# Patient Record
Sex: Female | Born: 1970 | Race: Black or African American | Hispanic: No | Marital: Single | Smoking: Never smoker
Health system: Southern US, Community
[De-identification: ages and names within clinical notes are randomized; demographics above are authoritative.]

---

## 1998-06-30 ENCOUNTER — Ambulatory Visit (HOSPITAL_COMMUNITY): Admission: RE | Admit: 1998-06-30 | Discharge: 1998-06-30 | Payer: Self-pay | Admitting: General Surgery

## 1999-03-18 ENCOUNTER — Other Ambulatory Visit: Admission: RE | Admit: 1999-03-18 | Discharge: 1999-03-18 | Payer: Self-pay | Admitting: Obstetrics and Gynecology

## 1999-12-01 ENCOUNTER — Ambulatory Visit (HOSPITAL_BASED_OUTPATIENT_CLINIC_OR_DEPARTMENT_OTHER): Admission: RE | Admit: 1999-12-01 | Discharge: 1999-12-01 | Payer: Self-pay | Admitting: Orthopaedic Surgery

## 2000-07-07 ENCOUNTER — Other Ambulatory Visit: Admission: RE | Admit: 2000-07-07 | Discharge: 2000-07-07 | Payer: Self-pay | Admitting: Obstetrics and Gynecology

## 2001-07-31 ENCOUNTER — Other Ambulatory Visit: Admission: RE | Admit: 2001-07-31 | Discharge: 2001-07-31 | Payer: Self-pay | Admitting: Obstetrics and Gynecology

## 2002-08-03 ENCOUNTER — Other Ambulatory Visit: Admission: RE | Admit: 2002-08-03 | Discharge: 2002-08-03 | Payer: Self-pay | Admitting: Obstetrics and Gynecology

## 2003-08-09 ENCOUNTER — Other Ambulatory Visit: Admission: RE | Admit: 2003-08-09 | Discharge: 2003-08-09 | Payer: Self-pay | Admitting: Obstetrics and Gynecology

## 2004-08-28 ENCOUNTER — Other Ambulatory Visit: Admission: RE | Admit: 2004-08-28 | Discharge: 2004-08-28 | Payer: Self-pay | Admitting: Obstetrics and Gynecology

## 2005-09-01 ENCOUNTER — Other Ambulatory Visit: Admission: RE | Admit: 2005-09-01 | Discharge: 2005-09-01 | Payer: Self-pay | Admitting: Obstetrics and Gynecology

## 2005-11-02 ENCOUNTER — Encounter: Admission: RE | Admit: 2005-11-02 | Discharge: 2005-11-02 | Payer: Self-pay | Admitting: Obstetrics and Gynecology

## 2010-08-23 ENCOUNTER — Encounter: Payer: Self-pay | Admitting: Obstetrics and Gynecology

## 2011-03-29 ENCOUNTER — Other Ambulatory Visit: Payer: Self-pay | Admitting: Internal Medicine

## 2011-03-29 DIAGNOSIS — R51 Headache: Secondary | ICD-10-CM

## 2011-03-31 ENCOUNTER — Ambulatory Visit
Admission: RE | Admit: 2011-03-31 | Discharge: 2011-03-31 | Disposition: A | Discharge status: Home / Self Care | Payer: BC Managed Care – PPO | Source: Ambulatory Visit | Attending: Internal Medicine | Admitting: Internal Medicine

## 2011-03-31 DIAGNOSIS — R51 Headache: Secondary | ICD-10-CM

## 2011-04-08 ENCOUNTER — Encounter (INDEPENDENT_AMBULATORY_CARE_PROVIDER_SITE_OTHER): Payer: BC Managed Care – PPO | Admitting: Ophthalmology

## 2011-04-08 DIAGNOSIS — H40019 Open angle with borderline findings, low risk, unspecified eye: Secondary | ICD-10-CM

## 2011-04-08 DIAGNOSIS — H5210 Myopia, unspecified eye: Secondary | ICD-10-CM

## 2011-04-08 DIAGNOSIS — H43819 Vitreous degeneration, unspecified eye: Secondary | ICD-10-CM

## 2016-03-25 ENCOUNTER — Other Ambulatory Visit: Payer: Self-pay | Admitting: Internal Medicine

## 2016-03-25 ENCOUNTER — Ambulatory Visit
Admission: RE | Admit: 2016-03-25 | Discharge: 2016-03-25 | Disposition: A | Discharge status: Home / Self Care | Payer: BC Managed Care – PPO | Source: Ambulatory Visit | Attending: Internal Medicine | Admitting: Internal Medicine

## 2016-03-25 DIAGNOSIS — M79645 Pain in left finger(s): Secondary | ICD-10-CM

## 2017-09-06 ENCOUNTER — Other Ambulatory Visit: Payer: Self-pay | Admitting: Internal Medicine

## 2017-09-06 DIAGNOSIS — R1012 Left upper quadrant pain: Secondary | ICD-10-CM

## 2017-09-09 ENCOUNTER — Ambulatory Visit
Admission: RE | Admit: 2017-09-09 | Discharge: 2017-09-09 | Disposition: A | Discharge status: Home / Self Care | Payer: BC Managed Care – PPO | Source: Ambulatory Visit | Attending: Internal Medicine | Admitting: Internal Medicine

## 2017-09-09 DIAGNOSIS — R1012 Left upper quadrant pain: Secondary | ICD-10-CM

## 2019-01-14 ENCOUNTER — Emergency Department (HOSPITAL_COMMUNITY)
Admission: EM | Admit: 2019-01-14 | Discharge: 2019-01-14 | Disposition: A | Discharge status: Home / Self Care | Payer: BC Managed Care – PPO | Attending: Emergency Medicine | Admitting: Emergency Medicine

## 2019-01-14 ENCOUNTER — Encounter (HOSPITAL_COMMUNITY): Payer: Self-pay | Admitting: *Deleted

## 2019-01-14 ENCOUNTER — Other Ambulatory Visit: Payer: Self-pay

## 2019-01-14 DIAGNOSIS — S30861A Insect bite (nonvenomous) of abdominal wall, initial encounter: Secondary | ICD-10-CM | POA: Insufficient documentation

## 2019-01-14 DIAGNOSIS — Y929 Unspecified place or not applicable: Secondary | ICD-10-CM | POA: Insufficient documentation

## 2019-01-14 DIAGNOSIS — W57XXXA Bitten or stung by nonvenomous insect and other nonvenomous arthropods, initial encounter: Secondary | ICD-10-CM | POA: Insufficient documentation

## 2019-01-14 DIAGNOSIS — Y998 Other external cause status: Secondary | ICD-10-CM | POA: Diagnosis not present

## 2019-01-14 DIAGNOSIS — Y9389 Activity, other specified: Secondary | ICD-10-CM | POA: Insufficient documentation

## 2019-01-14 MED ORDER — DOXYCYCLINE HYCLATE 100 MG PO TABS
200.0000 mg | ORAL_TABLET | Freq: Once | ORAL | Status: AC
  Administered 2019-01-14: 200 mg via ORAL
  Filled 2019-01-14: qty 2

## 2019-01-14 MED ORDER — DOXYCYCLINE HYCLATE 100 MG PO CAPS: 200.0000 mg | ORAL_CAPSULE | Freq: Two times a day (BID) | ORAL | 0 refills | Status: AC

## 2019-01-14 NOTE — ED Provider Notes (Signed)
[facility] Provider Note   CSN: [id number] Arrival date & time: 01/14/19  1800    History   Chief Complaint Chief Complaint  Patient presents with  . Insect Bite    HPI Angela Obrien is a 48 y.o. female who presents with possible tick bite to umbilicus that she noticed 3 days ago. Pt is unsure if it is a tick or if it is a mole but wanted to come and get evaluated; she thinks she may have noticed some legs coming out as well. Pt works inside; she states she has not been outside anywhere in the woods; she takes out her trash and thinks that's the only time she may have come into contact with a tick. No other areas on her body. Denies headache, vision changes, dizziness, lightheadedness, myalgias, or any other symptoms.       History reviewed. No pertinent past medical history.  There are no active problems to display for this patient.   History reviewed. No pertinent surgical history.   OB History   No obstetric history on file.      Home Medications    Prior to Admission medications   Not on File    Family History History reviewed. No pertinent family history.  Social History Social History   Tobacco Use  . Smoking status: Never Smoker  Substance Use Topics  . Alcohol use: Not on file  . Drug use: Not on file     Allergies   Patient has no known allergies.   Review of Systems Review of Systems  Constitutional: Negative for chills and fever.  Eyes: Negative for visual disturbance.  Musculoskeletal: Negative for arthralgias and myalgias.  Skin: Positive for rash.  Neurological: Negative for dizziness, light-headedness and headaches.     Physical Exam Updated Vital Signs BP (!) 157/93 (BP Location: Right Arm)   Pulse (!) 125   Temp 98.4 F (36.9 C) (Oral)   Resp 18   SpO2 96%   Physical Exam Vitals signs and nursing note reviewed.  Constitutional:      Appearance: She is not ill-appearing.  HENT:      Head: Normocephalic and atraumatic.  Eyes:     Conjunctiva/sclera: Conjunctivae normal.  Cardiovascular:     Rate and Rhythm: Normal rate and regular rhythm.  Pulmonary:     Effort: Pulmonary effort is normal.     Breath sounds: Normal breath sounds.  Musculoskeletal:     Comments: Engorged tick to umbilicus at 3 o'clock position; no erythema or edema; no EM rash  Skin:    General: Skin is warm and dry.     Coloration: Skin is not jaundiced.  Neurological:     Mental Status: She is alert.      ED Treatments / Results  Labs (all labs ordered are listed, but only abnormal results are displayed) Labs Reviewed - No data to display  EKG None  Radiology No results found.  Procedures .Foreign Body Removal  Date/Time: 01/14/2019 7:32 PM Performed by: Tanda RockersVenter, Julionna Marczak, PA-C Authorized by: Tanda RockersVenter, Quinntin Malter, PA-C  Consent: Verbal consent obtained. Consent given by: patient Patient identity confirmed: verbally with patient Body area: skin General location: trunk Location details: abdomen Complexity: simple 1 objects recovered. Objects recovered: Tick Post-procedure assessment: foreign body removed   (including critical care time)  Medications Ordered in ED Medications  doxycycline (VIBRA-TABS) tablet 200 mg (200 mg Oral Given 01/14/19 1903)     Initial Impression / Assessment and Plan /  ED Course  I have reviewed the triage vital signs and the nursing notes.  Pertinent labs & imaging results that were available during my care of the patient were reviewed by me and considered in my medical decision making (see chart for details).     Pt is a 48 year old female for evaluated of "spot" that she noticed to her umbilicus a couple of days ago; unsure if it is a tick or a mole. No known exposure to ticks/area where ticks are present. Pt stays indoors mostly. No other symptoms at this time including dizziness, lightheadedness, headache, vision changes, myalgias.   Tick  present on exam; removed without difficulty and cleaned with alcohol. Prophylactic doxycycline given in the ED to cover for RMSF and Lyme disease. Advised to follow up with PCP. Initially tachycardic to 125; improved to 100 after removal of tick. Pt believes she may have just been worked up when she got in. Pt stable for discharge home at this time. Strict return precautions discussed.   Received call from patient after she was discharged; she reports she went home and had 1 episode of vomiting. She believes she may have vomited her antibiotics; she reports this happens to her when she takes medications on an empty stomach. Will prescribe prophylactic dose of doxycycline for patient to take in the AM to ensure she is covered. Pt is in agreement with plan.        Final Clinical Impressions(s) / ED Diagnoses   Final diagnoses:  Tick bite, initial encounter    ED Discharge Orders    None       Eustaquio Maize, Hershal Coria 01/14/19 2258    Jola Schmidt, MD 01/15/19 1404

## 2019-01-14 NOTE — ED Notes (Signed)
Pt verbalized written and verbal discharge instructions. Left ambulatory with steady gait to POV.

## 2019-01-14 NOTE — Discharge Instructions (Addendum)
Please see resource guide attached for symptoms to look out for; if you experience any of the symptoms please return to the ED immediately.

## 2019-01-14 NOTE — ED Triage Notes (Signed)
Pt reports having possible tick or possible mole near umbilicus. Has mild pain to area but no redness.

## 2019-01-15 ENCOUNTER — Telehealth: Payer: Self-pay | Admitting: *Deleted

## 2019-01-15 NOTE — Telephone Encounter (Signed)
Pharmacy called related to Rx: doxycycline dosage too high .Marland KitchenMarland KitchenEDCM clarified with EDP (Petrucelli) to change Rx to: doxycycline 200 mg, take one tablet and Zofran 4mg  tablet, take 1 tablet Q4 PRN N, V #5  .

## 2020-08-12 ENCOUNTER — Encounter (INDEPENDENT_AMBULATORY_CARE_PROVIDER_SITE_OTHER): Payer: Self-pay | Admitting: Otolaryngology

## 2020-08-12 ENCOUNTER — Other Ambulatory Visit: Payer: Self-pay

## 2020-08-12 ENCOUNTER — Ambulatory Visit (INDEPENDENT_AMBULATORY_CARE_PROVIDER_SITE_OTHER): Payer: BC Managed Care – PPO | Admitting: Otolaryngology

## 2020-08-12 VITALS — Temp 97.0°F

## 2020-08-12 DIAGNOSIS — H9201 Otalgia, right ear: Secondary | ICD-10-CM

## 2020-08-12 NOTE — Progress Notes (Signed)
HPI: Angela Obrien is a 50 y.o. female who returns today for evaluation of ears.  She has not had her ears cleaned in a while and wanted them checked.  More recently has had some intermittent right ear pain that comes and goes and may last 5 to .  She has not noted any hearing problems.  No drainage from the ears..  No past medical history on file. No past surgical history on file. Social History   Socioeconomic History  . Marital status: Single    Spouse name: Not on file  . Number of children: Not on file  . Years of education: Not on file  . Highest education level: Not on file  Occupational History  . Not on file  Tobacco Use  . Smoking status: Never Smoker  . Smokeless tobacco: Never Used  Substance and Sexual Activity  . Alcohol use: Not on file  . Drug use: Not on file  . Sexual activity: Not on file  Other Topics Concern  . Not on file  Social History Narrative  . Not on file   Social Determinants of Health   Financial Resource Strain: Not on file  Food Insecurity: Not on file  Transportation Needs: Not on file  Physical Activity: Not on file  Stress: Not on file  Social Connections: Not on file   No family history on file. No Known Allergies Prior to Admission medications   Medication Sig Start Date End Date Taking? Authorizing Provider  doxycycline (VIBRAMYCIN) 100 MG capsule Take 2 capsules (200 mg total) by mouth 2 (two) times daily. Patient not taking: Reported on 08/12/2020 01/14/19   Tanda Rockers, PA-C     Positive ROS: Otherwise negative  All other systems have been reviewed and were otherwise negative with the exception of those mentioned in the HPI and as above.  Physical Exam: Constitutional: Alert, well-appearing, no acute distress Ears: External ears without lesions or tenderness. Ear canals are clear bilaterally with no significant wax buildup.  TMs were clear bilaterally on microscopic exam with no middle ear abnormality noted.   Hearing screening with a tuning forks revealed good hearing in both ears. Nasal: External nose without lesions. Septum midline with mild rhinitis. Clear nasal passages Oral: Lips and gums without lesions. Tongue and palate mucosa without lesions. Posterior oropharynx clear.  Tonsil regions appear clear bilaterally. Neck: No palpable adenopathy or masses.  No palpable adenopathy.  No TMJ discomfort noted. Respiratory: Breathing comfortably  Skin: No facial/neck lesions or rash noted.  Procedures  Assessment: Intermittent right ear pain questionable etiology with normal examination otherwise.  Plan: Did not recommend any specific therapy at this point outside of trying to determine what may be causing the ear discomfort.  She will follow-up as needed.   Narda Bonds, MD

## 2021-02-04 ENCOUNTER — Other Ambulatory Visit: Payer: Self-pay | Admitting: Obstetrics & Gynecology

## 2021-02-04 DIAGNOSIS — R928 Other abnormal and inconclusive findings on diagnostic imaging of breast: Secondary | ICD-10-CM

## 2021-02-23 ENCOUNTER — Other Ambulatory Visit: Payer: Self-pay | Admitting: Obstetrics & Gynecology

## 2021-02-23 ENCOUNTER — Other Ambulatory Visit: Payer: Self-pay

## 2021-02-23 ENCOUNTER — Ambulatory Visit
Admission: RE | Admit: 2021-02-23 | Discharge: 2021-02-23 | Disposition: A | Discharge status: Home / Self Care | Payer: BC Managed Care – PPO | Source: Ambulatory Visit | Attending: Obstetrics & Gynecology | Admitting: Obstetrics & Gynecology

## 2021-02-23 DIAGNOSIS — R928 Other abnormal and inconclusive findings on diagnostic imaging of breast: Secondary | ICD-10-CM

## 2021-02-23 DIAGNOSIS — N63 Unspecified lump in unspecified breast: Secondary | ICD-10-CM

## 2021-03-04 ENCOUNTER — Other Ambulatory Visit: Payer: BC Managed Care – PPO

## 2021-03-04 ENCOUNTER — Other Ambulatory Visit: Payer: Self-pay | Admitting: Obstetrics & Gynecology

## 2021-03-04 ENCOUNTER — Other Ambulatory Visit: Payer: Self-pay

## 2021-03-04 ENCOUNTER — Ambulatory Visit
Admission: RE | Admit: 2021-03-04 | Discharge: 2021-03-04 | Disposition: A | Discharge status: Home / Self Care | Payer: BC Managed Care – PPO | Source: Ambulatory Visit | Attending: Obstetrics & Gynecology | Admitting: Obstetrics & Gynecology

## 2021-03-04 DIAGNOSIS — N63 Unspecified lump in unspecified breast: Secondary | ICD-10-CM

## 2021-03-19 ENCOUNTER — Ambulatory Visit
Admission: RE | Admit: 2021-03-19 | Discharge: 2021-03-19 | Disposition: A | Discharge status: Home / Self Care | Payer: BC Managed Care – PPO | Source: Ambulatory Visit | Attending: Obstetrics & Gynecology | Admitting: Obstetrics & Gynecology

## 2021-03-19 ENCOUNTER — Other Ambulatory Visit: Payer: Self-pay

## 2021-03-19 DIAGNOSIS — N63 Unspecified lump in unspecified breast: Secondary | ICD-10-CM

## 2021-03-19 HISTORY — PX: BREAST BIOPSY: SHX20

## 2022-07-07 ENCOUNTER — Other Ambulatory Visit: Payer: Self-pay | Admitting: Obstetrics & Gynecology

## 2022-07-07 DIAGNOSIS — N644 Mastodynia: Secondary | ICD-10-CM

## 2022-07-20 ENCOUNTER — Ambulatory Visit
Admission: RE | Admit: 2022-07-20 | Discharge: 2022-07-20 | Disposition: A | Discharge status: Home / Self Care | Payer: BC Managed Care – PPO | Source: Ambulatory Visit | Attending: Obstetrics & Gynecology | Admitting: Obstetrics & Gynecology

## 2022-07-20 DIAGNOSIS — N644 Mastodynia: Secondary | ICD-10-CM

## 2022-12-19 IMAGING — MG MM BREAST LOCALIZATION CLIP
4 series · 4 of 12 positions shown · non-contrast
Comparison: Previous exam(s).

CLINICAL DATA: Post biopsy mammogram of the left breast for clip
placement.

EXAM:
3D DIAGNOSTIC LEFT MAMMOGRAM POST STEREOTACTIC BIOPSY

[L LM synth-2D]
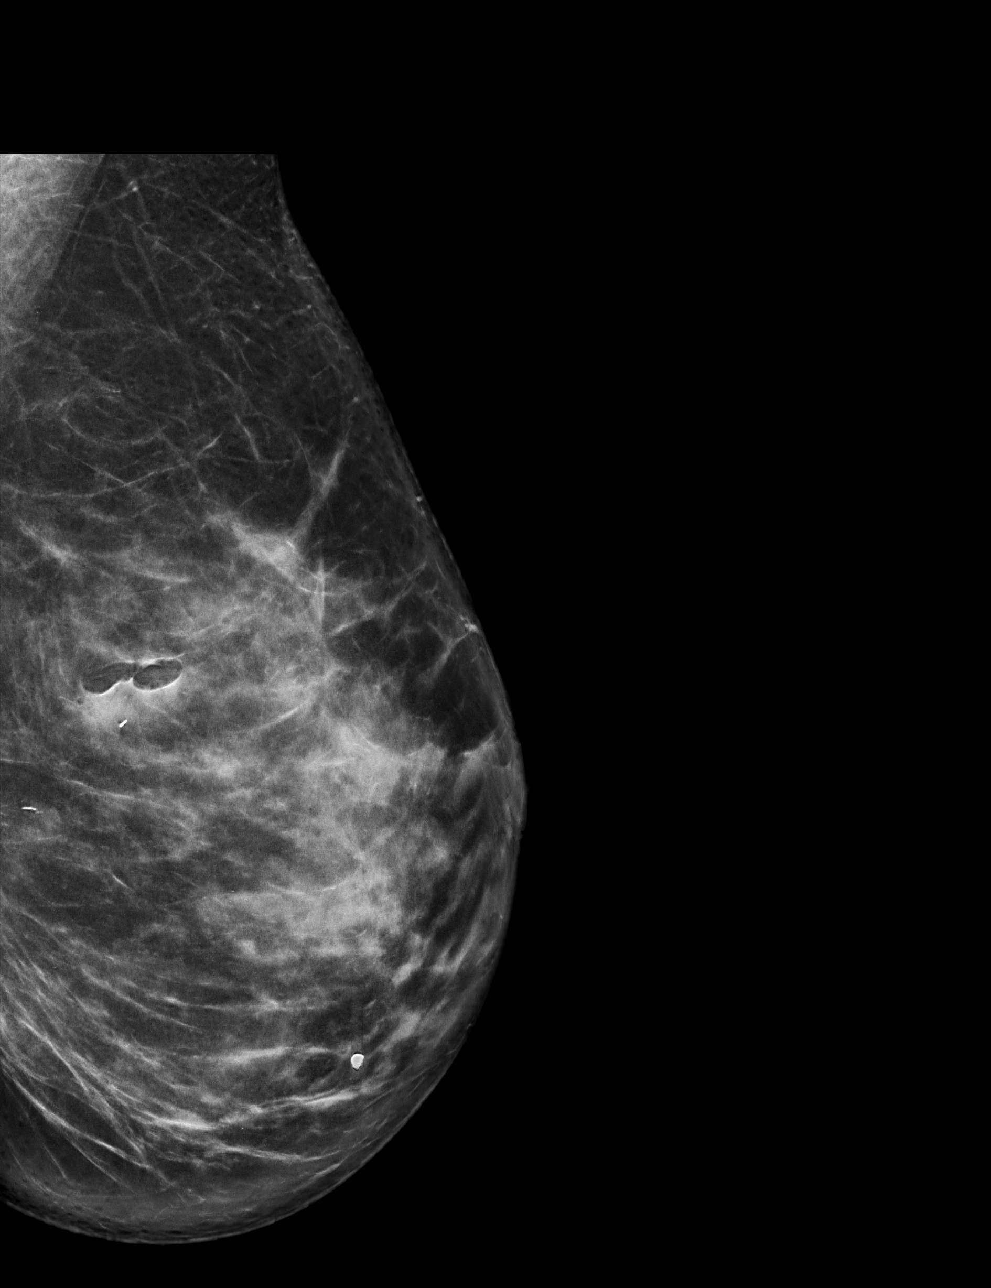

[L CC synth-2D]
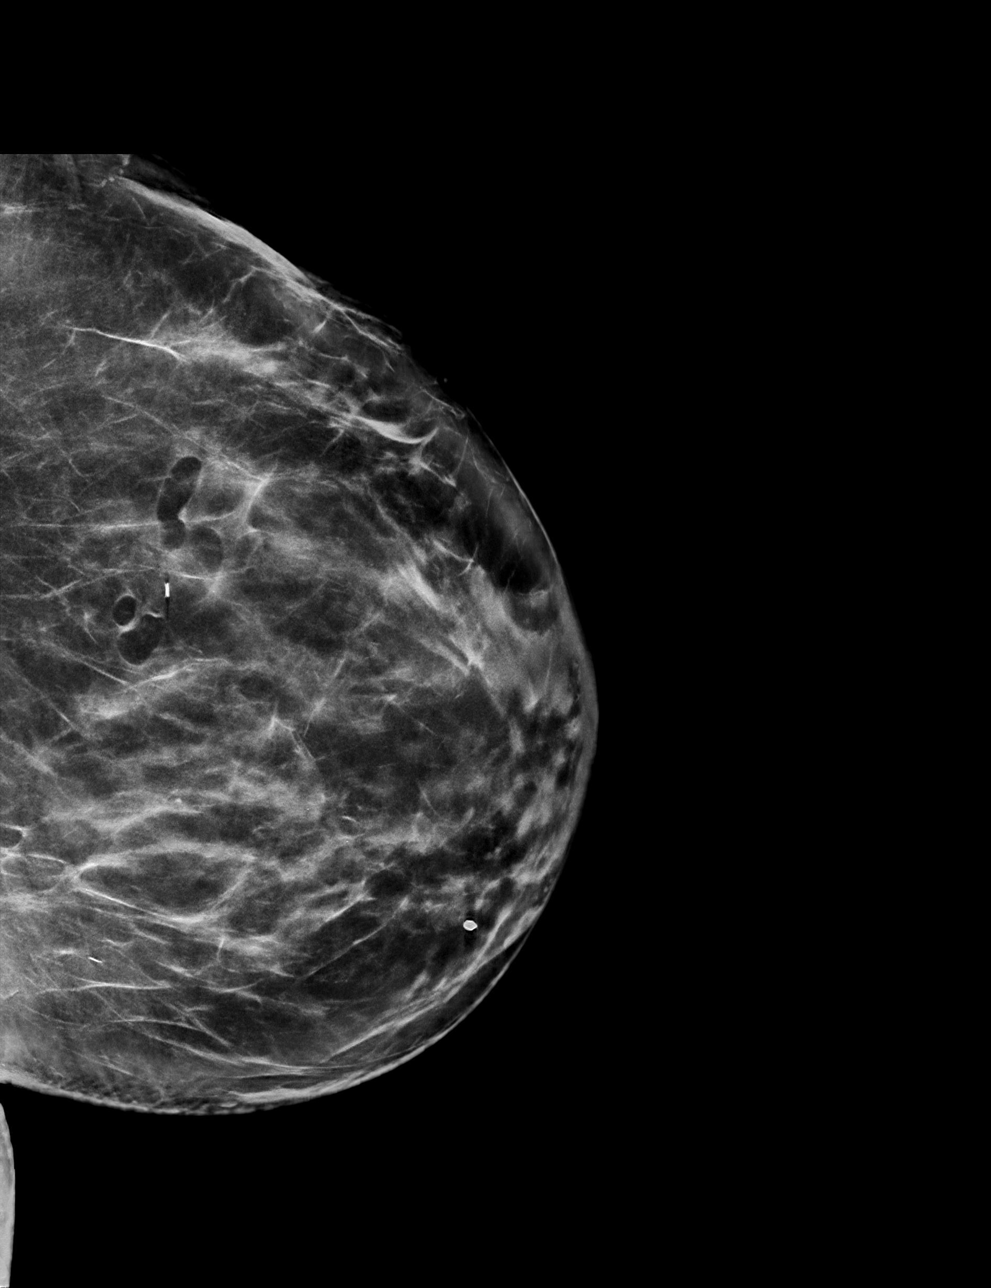

[L CC tomo · tomo slice 45/88.0]
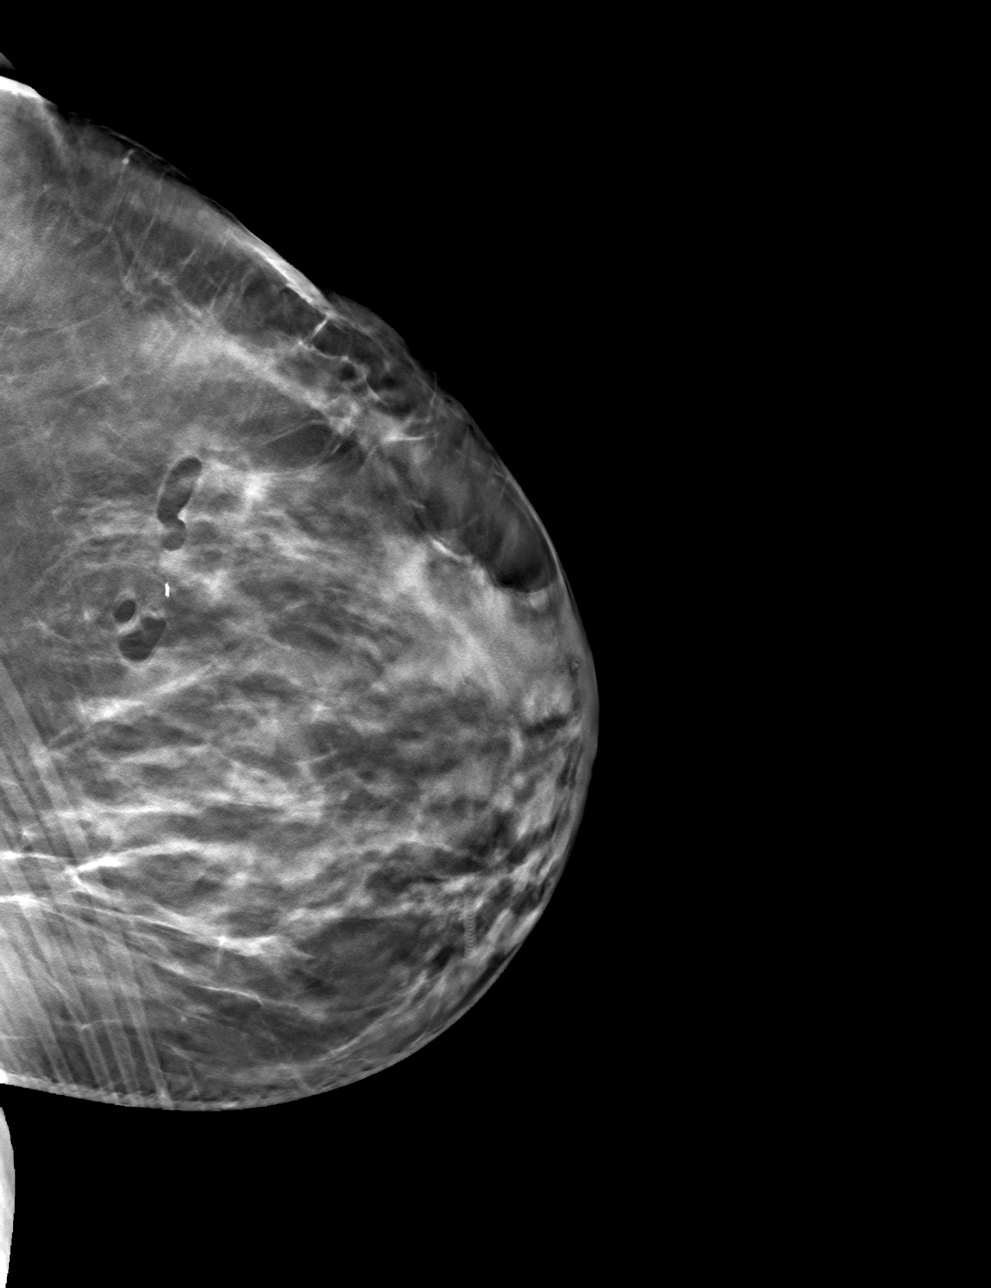

[L LM tomo · tomo slice 45/88.0]
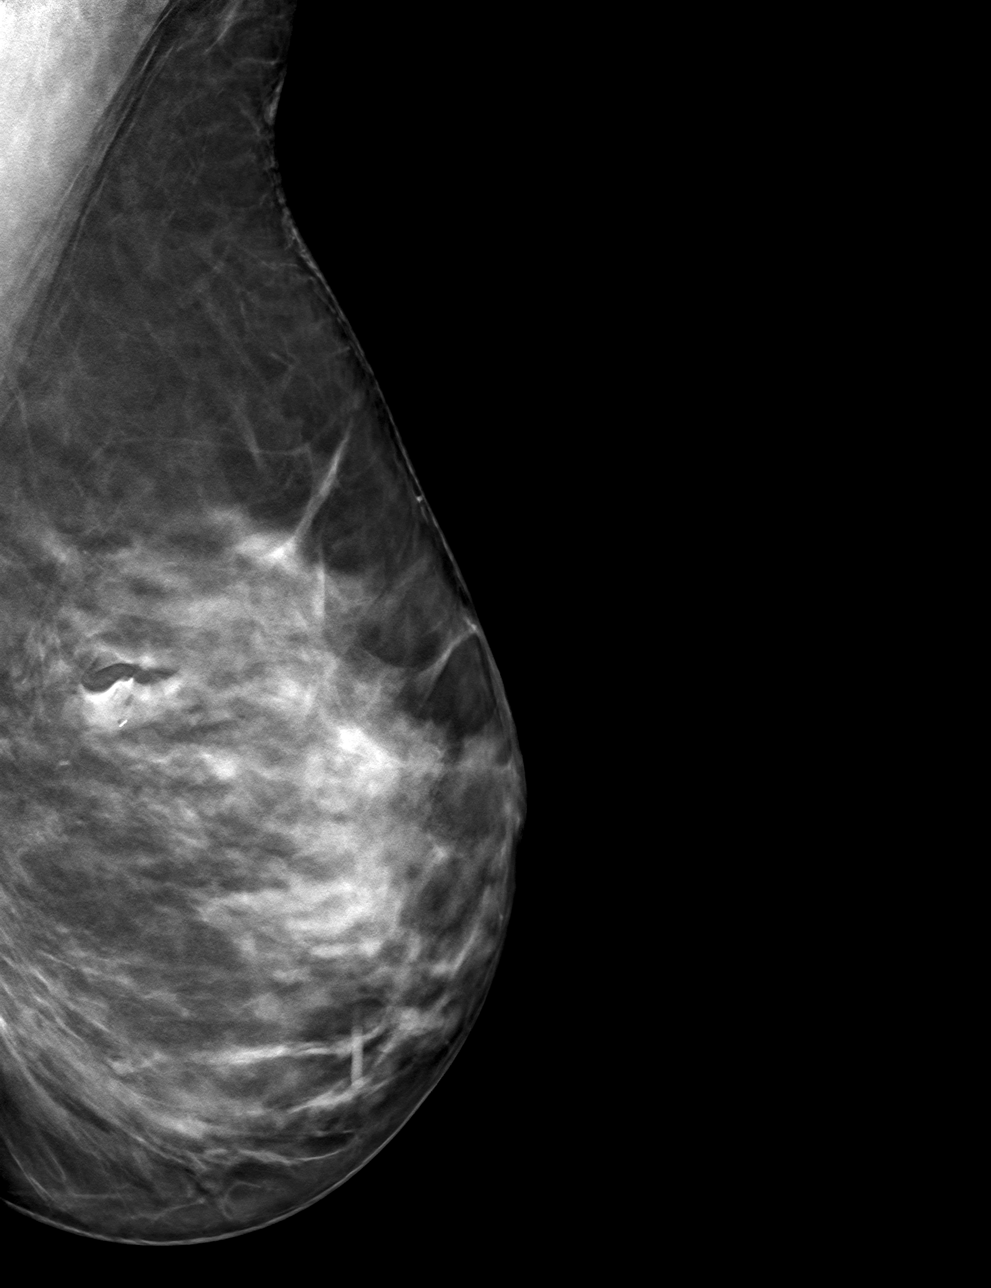

[4 of 12 positions shown; findings below may reference images not displayed]

FINDINGS: 3D Mammographic images were obtained following stereotactic guided
biopsy of a mass in the upper-outer left breast. The biopsy marking
clip is in expected position at the site of biopsy.
IMPRESSION: Appropriate positioning of the X shaped biopsy marking clip at the
site of biopsy in the upper-outer left breast.

Final Assessment: Post Procedure Mammograms for Marker Placement
